# Patient Record
Sex: Female | Born: 1970 | Race: White | Hispanic: No | Marital: Single
Health system: Southern US, Community
[De-identification: ages and names within clinical notes are randomized; demographics above are authoritative.]

---

## 2004-12-30 ENCOUNTER — Emergency Department: Payer: Self-pay | Admitting: Emergency Medicine

## 2017-12-29 ENCOUNTER — Emergency Department
Admission: EM | Admit: 2017-12-29 | Discharge: 2017-12-29 | Disposition: A | Discharge status: Home / Self Care | Payer: Self-pay | Attending: Emergency Medicine | Admitting: Emergency Medicine

## 2017-12-29 ENCOUNTER — Other Ambulatory Visit: Payer: Self-pay

## 2017-12-29 DIAGNOSIS — H6901 Patulous Eustachian tube, right ear: Secondary | ICD-10-CM | POA: Insufficient documentation

## 2017-12-29 MED ORDER — POTASSIUM IODIDE (EXPECTORANT) 1 GM/ML PO SOLN: 8.0000 [drp] | Freq: Three times a day (TID) | ORAL | 0 refills | Status: AC

## 2017-12-29 NOTE — ED Notes (Signed)
See triage note  Presents with right ear pain  States she has been seen and treated for ear infection about 1 month ago  conts to have pain to same ear

## 2017-12-29 NOTE — ED Triage Notes (Signed)
Pt come via POV with c/o right ear pain. Pt states this started 3-4 months ago. Pt states she was seen for it and nothing has made it better. Pt taken antibiotics, nasal spray and no relief.  Pt states no drainage but she can feel the fluid in her ear.

## 2017-12-29 NOTE — ED Provider Notes (Signed)
[facility] [address] Emergency Department Provider Note  ____________________________________________  Time seen: Approximately 4:29 PM  I have reviewed the triage vital signs and the nursing notes.   HISTORY  Chief Complaint Otalgia    HPI Brooke Wallace is a 47 y.o. female who presents the emergency department complaining of ongoing right ear fullness, popping sensation, intermittent pain.  Patient reports that she was diagnosed with eustachian tube dysfunction, started on multiple different medications for same.  Patient has been placed on antibiotics, nasal sprays, antihistamine medications.  Patient reports that symptoms do not improve with any of these medications.  No trauma to the ear.  No drainage from same.  Patient denies any other URI or allergic rhinitis type symptoms to include sinus headaches, sinus pressure, nasal congestion, sore throat, cough.  No history of reflux.   Patient denies any hearing loss or changes.  She denies any vertigo symptoms.   History reviewed. No pertinent past medical history.  There are no active problems to display for this patient.   History reviewed. No pertinent surgical history.  Prior to Admission medications   Medication Sig Start Date End Date Taking? Authorizing Provider  potassium iodide (SSKI) 1 GM/ML solution Take 0.4 mLs (400 mg total) by mouth 3 (three) times daily. Mix in juice 12/29/17   Cuthriell, Delorise Royals, PA-C    Allergies Patient has no known allergies.  No family history on file.  Social History Social History   Tobacco Use  . Smoking status: Not on file  Substance Use Topics  . Alcohol use: Not on file  . Drug use: Not on file     Review of Systems  Constitutional: No fever/chills Eyes: No visual changes. No discharge ENT: Positive for right ear fullness, pressure. Cardiovascular: no chest pain. Respiratory: no cough. No SOB. Gastrointestinal: No abdominal pain.  No nausea, no vomiting.    Musculoskeletal: Negative for musculoskeletal pain. Skin: Negative for rash, abrasions, lacerations, ecchymosis. Neurological: Negative for headaches, focal weakness or numbness. 10-point ROS otherwise negative.  ____________________________________________   PHYSICAL EXAM:  VITAL SIGNS: ED Triage Vitals  Enc Vitals Group     BP 12/29/17 1443 (!) 133/49     Pulse Rate 12/29/17 1443 67     Resp 12/29/17 1443 18     Temp 12/29/17 1443 98.3 F (36.8 C)     Temp src --      SpO2 12/29/17 1443 100 %     Weight 12/29/17 1444 155 lb (70.3 kg)     Height 12/29/17 1444 5\' 3"  (1.6 m)     Head Circumference --      Peak Flow --      Pain Score 12/29/17 1443 6     Pain Loc --      Pain Edu? --      Excl. in GC? --      Constitutional: Alert and oriented. Well appearing and in no acute distress. Eyes: Conjunctivae are normal. PERRL. EOMI. Head: Atraumatic. ENT:      Ears: EACs unremarkable bilaterally.  TM on right is mildly bulging.  No tympanic membrane erythema or injection.  No choleastoma identified.  No tympanic membrane perforation.  No insufflator bulb was available for testing.  Using whisper test, no significant differences.      Nose: No congestion/rhinnorhea.      Mouth/Throat: Mucous membranes are moist.  Neck: No stridor.    Cardiovascular: Normal rate, regular rhythm. Normal S1 and S2.  Good peripheral circulation. Respiratory: Normal  respiratory effort without tachypnea or retractions. Lungs CTAB. Good air entry to the bases with no decreased or absent breath sounds. Musculoskeletal: Full range of motion to all extremities. No gross deformities appreciated. Neurologic:  Normal speech and language. No gross focal neurologic deficits are appreciated.  Skin:  Skin is warm, dry and intact. No rash noted. Psychiatric: Mood and affect are normal. Speech and behavior are normal. Patient exhibits appropriate insight and  judgement.   ____________________________________________   LABS (all labs ordered are listed, but only abnormal results are displayed)  Labs Reviewed - No data to display ____________________________________________  EKG   ____________________________________________  RADIOLOGY   No results found.  ____________________________________________    PROCEDURES  Procedure(s) performed:    Procedures    Medications - No data to display   ____________________________________________   INITIAL IMPRESSION / ASSESSMENT AND PLAN / ED COURSE  Pertinent labs & imaging results that were available during my care of the patient were reviewed by me and considered in my medical decision making (see chart for details).  Review of the [place] CSRS was performed in accordance of the [facility] prior to dispensing any controlled drugs.  Clinical Course as of Dec 29 1848  Thu Dec 29, 2017  1530 Patient presented to the emergency department with 3 to 4 months of ongoing eustachian tube dysfunction type symptoms.  Patient has been placed on multiple medications with no relief.  Differential included otitis media, otitis externa, eustachian tube dysfunction, choleastoma, Mnire's disease.  Patient has findings of chronic eustachian tube dysfunction on symptoms and exam.  Patient has been treated for obstructive eustachian tube dysfunction, at this point, I feel the diagnosis is more consistent with patulous eustachian tube dysfunction.  Patient is encouraged to use nasal saline rinses as well as potassium iodide see if if this improves symptoms.  If symptoms do not improve, follow-up with ENT for further management.   [JC]    Clinical Course User Index [JC] Cuthriell, Delorise Royals, PA-C     Patient's diagnosis is consistent with eustachian tube dysfunction, likely patulous eustachian tube dysfunction.  Patient presents with 3 to 72-month history of eustachian tube dysfunction symptoms.  Exam was  overall reassuring.  No indication of Mnire's disease.  No indication of chronic otitis media, tympanic membrane perforation, mastoiditis.  Patient has been treated multiple times for obstructive eustachian tube dysfunction.  Given no improvement, this is likely patulous eustachian tube dysfunction.  Patient will be treated with nasal saline flushes and potassium iodide.  If symptoms do not improve, follow-up with ENT for further management. Patient is given ED precautions to return to the ED for any worsening or new symptoms.     ____________________________________________  FINAL CLINICAL IMPRESSION(S) / ED DIAGNOSES  Final diagnoses:  Patulous eustachian tube of right ear      NEW MEDICATIONS STARTED DURING THIS VISIT:  ED Discharge Orders         Ordered    potassium iodide (SSKI) 1 GM/ML solution  3 times daily     12/29/17 1656              This chart was dictated using voice recognition software/Dragon. Despite best efforts to proofread, errors can occur which can change the meaning. Any change was purely unintentional.    Racheal Patches, PA-C 12/29/17 1851    Dionne Bucy, MD 12/29/17 [phone]

## 2018-09-25 ENCOUNTER — Other Ambulatory Visit: Payer: Self-pay | Admitting: Family Medicine

## 2018-09-25 DIAGNOSIS — N6310 Unspecified lump in the right breast, unspecified quadrant: Secondary | ICD-10-CM

## 2018-09-29 ENCOUNTER — Ambulatory Visit
Admission: RE | Admit: 2018-09-29 | Discharge: 2018-09-29 | Disposition: A | Discharge status: Home / Self Care | Payer: BC Managed Care – PPO | Source: Ambulatory Visit | Attending: Family Medicine | Admitting: Family Medicine

## 2018-09-29 ENCOUNTER — Other Ambulatory Visit: Payer: Self-pay

## 2018-09-29 DIAGNOSIS — N6310 Unspecified lump in the right breast, unspecified quadrant: Secondary | ICD-10-CM

## 2019-11-19 ENCOUNTER — Other Ambulatory Visit: Payer: Self-pay | Admitting: Family Medicine

## 2019-11-19 DIAGNOSIS — Z1231 Encounter for screening mammogram for malignant neoplasm of breast: Secondary | ICD-10-CM

## 2019-12-10 ENCOUNTER — Ambulatory Visit
Admission: RE | Admit: 2019-12-10 | Discharge: 2019-12-10 | Disposition: A | Discharge status: Home / Self Care | Payer: BC Managed Care – PPO | Source: Ambulatory Visit | Attending: Family Medicine | Admitting: Family Medicine

## 2019-12-10 ENCOUNTER — Other Ambulatory Visit: Payer: Self-pay

## 2019-12-10 DIAGNOSIS — Z1231 Encounter for screening mammogram for malignant neoplasm of breast: Secondary | ICD-10-CM | POA: Insufficient documentation

## 2021-06-03 ENCOUNTER — Other Ambulatory Visit: Payer: Self-pay | Admitting: Family Medicine

## 2021-06-03 DIAGNOSIS — Z1231 Encounter for screening mammogram for malignant neoplasm of breast: Secondary | ICD-10-CM

## 2021-08-05 IMAGING — MG DIGITAL SCREENING BILAT W/ TOMO W/ CAD
8 series · 8 of 24 positions shown · non-contrast
Comparison: Previous exam(s).

CLINICAL DATA: Screening.

EXAM:
DIGITAL SCREENING BILATERAL MAMMOGRAM WITH TOMO AND CAD

[R MLO synth-2D]
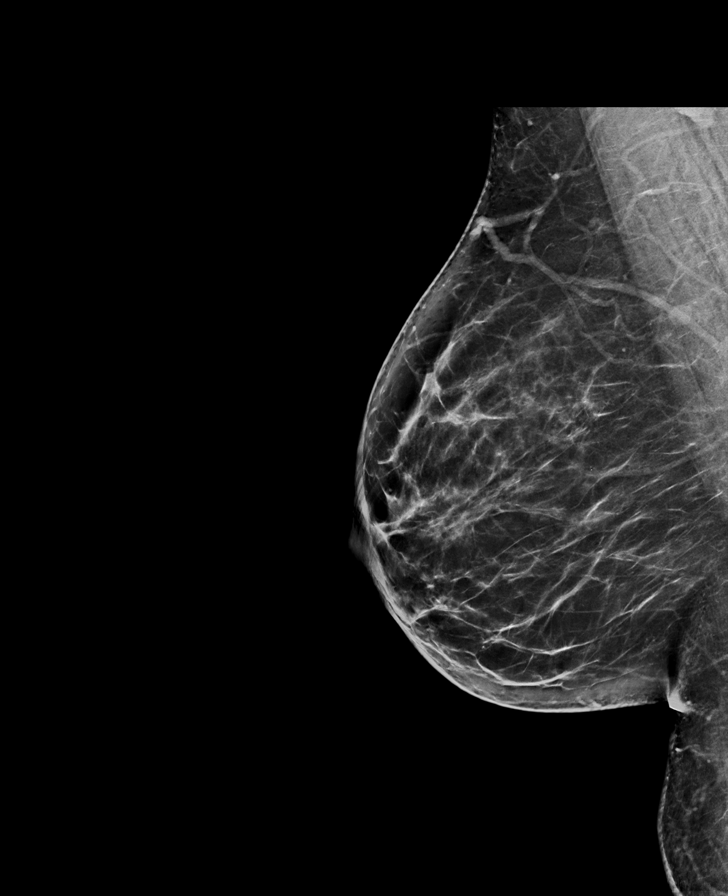

[L CC synth-2D]
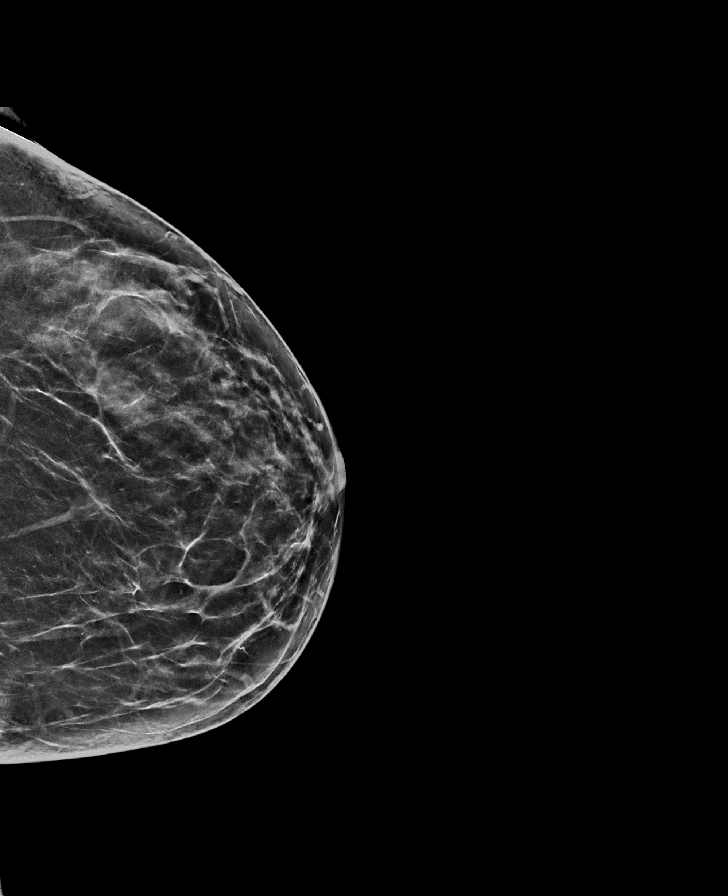

[L MLO synth-2D]
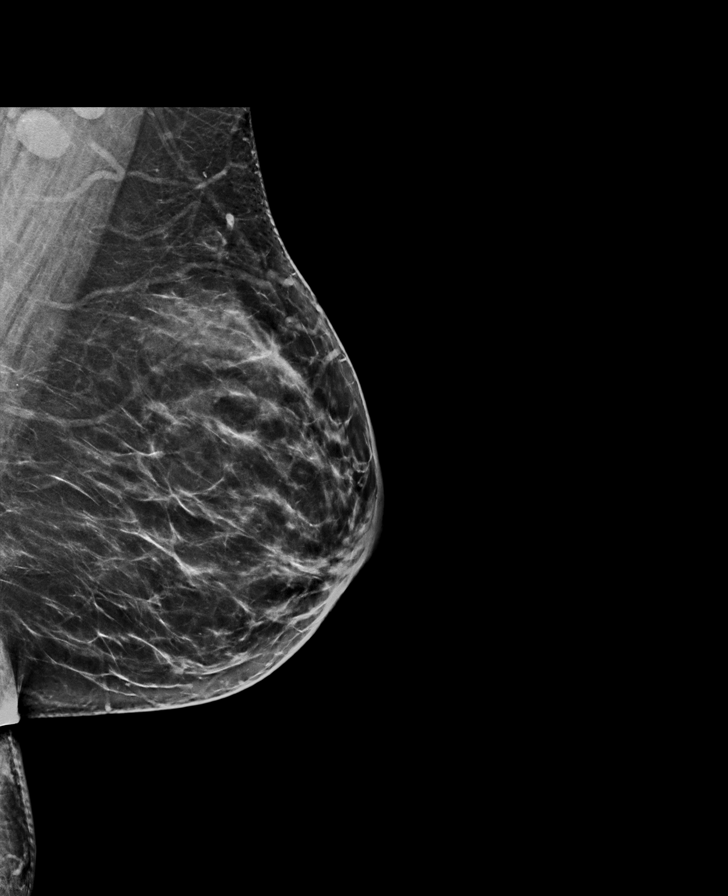

[R CC synth-2D]
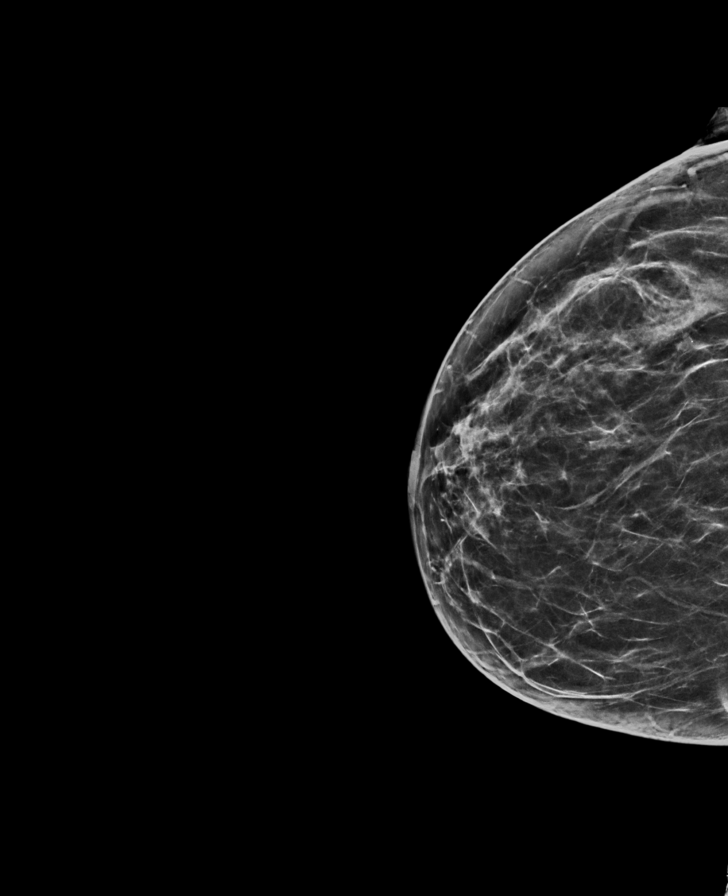

[R CC tomo · tomo slice 33/65.0]
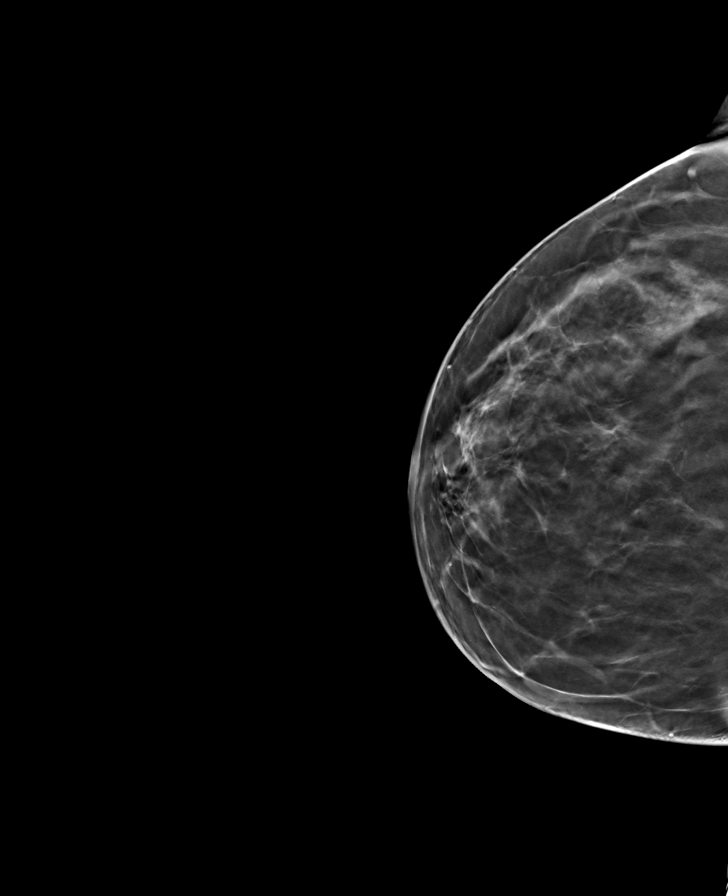

[R MLO tomo · tomo slice 39/78.0]
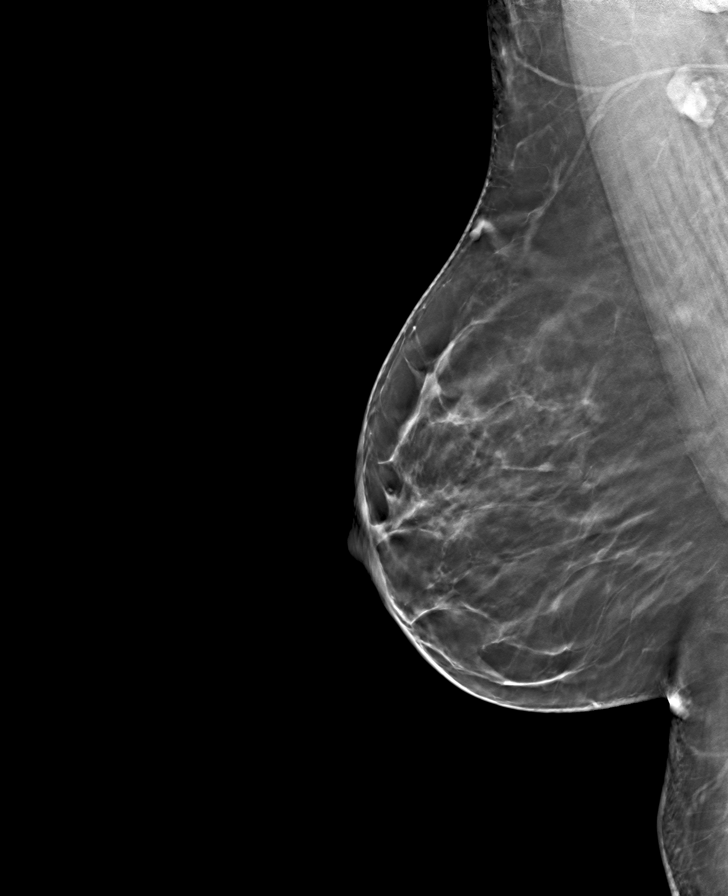

[L MLO tomo · tomo slice 39/78.0]
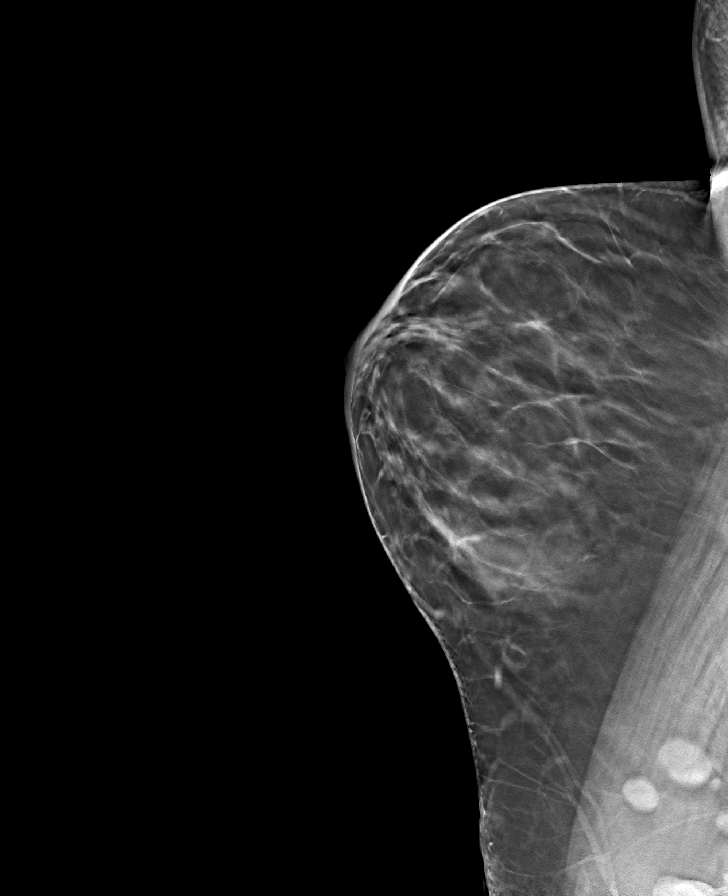

[L CC tomo · tomo slice 36/71.0]
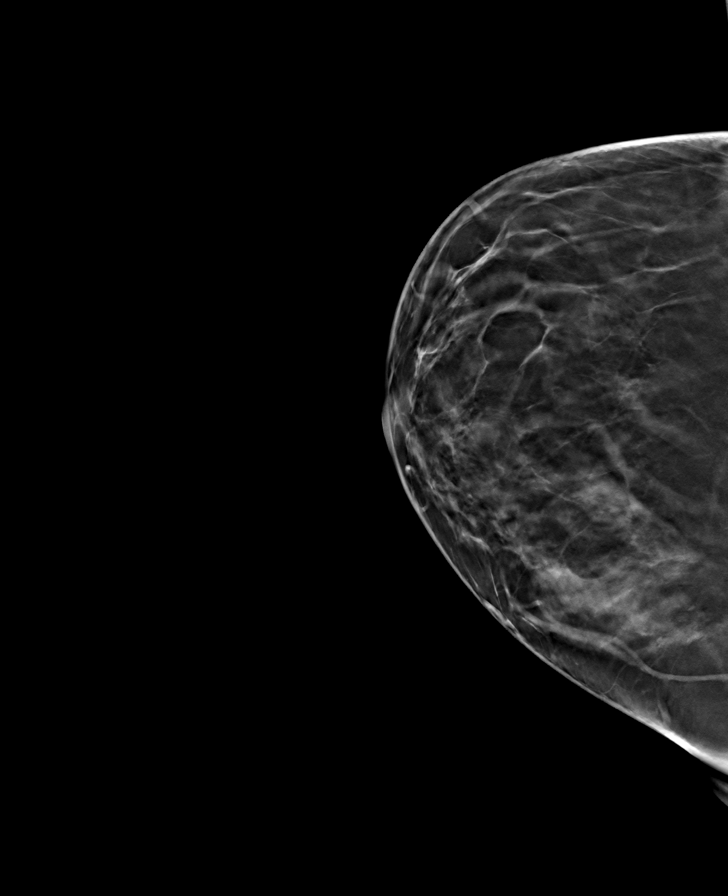

[8 of 24 positions shown; findings below may reference images not displayed]

ACR Breast Density Category c: The breast tissue is heterogeneously
dense, which may obscure small masses.
FINDINGS: There are no findings suspicious for malignancy. Images were
processed with CAD.
IMPRESSION: No mammographic evidence of malignancy. A result letter of this
screening mammogram will be mailed directly to the patient.

RECOMMENDATION:
Screening mammogram in one year. (Code:FT-U-LHB)

BI-RADS CATEGORY  1: Negative.
